# Patient Record
Sex: Male | Born: 1985 | Race: White | Hispanic: No | Marital: Married | Smoking: Former smoker
Health system: Southern US, Community
[De-identification: ages and names within clinical notes are randomized; demographics above are authoritative.]

## PROBLEM LIST (undated history)

## (undated) DIAGNOSIS — K2970 Gastritis, unspecified, without bleeding: Secondary | ICD-10-CM

## (undated) HISTORY — DX: Gastritis, unspecified, without bleeding: K29.70

---

## 2011-02-04 ENCOUNTER — Ambulatory Visit (INDEPENDENT_AMBULATORY_CARE_PROVIDER_SITE_OTHER): Payer: PRIVATE HEALTH INSURANCE | Admitting: Cardiology

## 2011-02-04 ENCOUNTER — Encounter: Payer: Self-pay | Admitting: Cardiology

## 2011-02-04 DIAGNOSIS — E663 Overweight: Secondary | ICD-10-CM

## 2011-02-04 DIAGNOSIS — R9431 Abnormal electrocardiogram [ECG] [EKG]: Secondary | ICD-10-CM

## 2011-02-04 DIAGNOSIS — F17200 Nicotine dependence, unspecified, uncomplicated: Secondary | ICD-10-CM

## 2011-02-05 ENCOUNTER — Encounter: Payer: Self-pay | Admitting: Cardiology

## 2011-02-05 DIAGNOSIS — F17200 Nicotine dependence, unspecified, uncomplicated: Secondary | ICD-10-CM | POA: Insufficient documentation

## 2011-02-05 DIAGNOSIS — R9431 Abnormal electrocardiogram [ECG] [EKG]: Secondary | ICD-10-CM | POA: Insufficient documentation

## 2011-02-05 DIAGNOSIS — E663 Overweight: Secondary | ICD-10-CM | POA: Insufficient documentation

## 2011-02-05 NOTE — Progress Notes (Signed)
Subjective:      Patient ID: Luis Thompson is a 25 y.o. male.  Chief Complaint: HPI The Patient presents for evaluation of an abnormal EKG. He was recently noted to have an RSR prime in V1 on EKG. I do not see other EKGs for comparison. He has been complaining of some pain in his posterior neck on left side. This is with activity such as lifting weights he is not describing chest pressure jaw or teeth discomfort. Is not having any arm discomfort shortness of breath, PND or orthopnea. He has had no palpitations, presyncope or syncope. He does do athletic activities such as being on the treadmill occasionally he has no problems with this. ROS Negative for all other systems except as stated in the HPI.   Objective:    Physical Exam  Constitutional: He appears healthy.  Eyes: Conjunctivae are normal. Pupils are equal, round, and reactive to light.  Neck: Thyroid normal. No JVD present. No adenopathy. No thyromegaly present.  Cardiovascular: Normal rate, regular rhythm, S1 normal, S2 normal, normal heart sounds and intact distal pulses.   Extrasystoles are present. PMI is not displaced.  Exam reveals no S3, no S4, no distant heart sounds, no friction rub, no midsystolic click and no opening snap.   No murmur heard. Pulses:      Carotid pulses are on the right side with bruit, and on the left side with bruit.      Radial pulses are 3+ on the right side, and 3+ on the left side.       Femoral pulses are 3+ on the right side, and 3+ on the left side.      Popliteal pulses are 3+ on the right side, and 3+ on the left side.       Dorsalis pedis pulses are 3+ on the right side, and 3+ on the left side.       Posterior tibial pulses are 3+ on the right side, and 3+ on the left side.  Pulmonary/Chest: Effort normal and breath sounds normal. He has no wheezes. He has no rales. He exhibits no tenderness.  Abdominal: Soft. Bowel sounds are normal. He exhibits no distension and no mass. There is no  splenomegaly or hepatomegaly. There is no tenderness. No hernia.  Musculoskeletal: He exhibits no edema.  Neurological: He is alert and oriented to person, place, and time. He has normal motor skills and intact cranial nerves.  Skin: No rash noted.    Lab Review:      Assessment:    Abnormal EKG  Plan:

## 2011-02-09 NOTE — Assessment & Plan Note (Signed)
Summary: consult: head pain after weight lifting. per debbie office 56...   Visit Type:  Follow-up Primary Provider:  Helene Kelp, PAc  CC:  Abnormal EKG.  History of Present Illness: The patient presents for evaluation of an abnormal EKG.  he was recently noted to have RSR prime in V1 and EKG. He said no other EKGs for comparison as far as I understand. He has been complaining of some pain in his posterior neck on the left side. This is with activity such as lifting weight. He is not describing chest pressure, jaw or teeth discomfort. He's not having any arm discomfort, shortness of breath, PND or orthopnea. He's had no palpitations, presyncope or syncope. He does do athletic activities such as being on a treadmill occasionally. He said no problems with this.  Current Medications (verified): 1)  None  Allergies (verified): No Known Drug Allergies  Past History:  Past Medical History: None  Past Surgical History: None  Family History: No early heart disease, cardiomyopathy, sudden cardiac death or syncope.  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 He has been smoking or 4 yearspain is down to one half pack per day.  Review of Systems       As stated in the HPI and negative for all other systems.   Vital Signs:  Patient profile:   25 year old male Height:      73 inches Weight:      249 pounds BMI:     32.97 Pulse rate:   66 / minute Resp:     16 per minute BP sitting:   131 / 76  (right arm)  Vitals Entered By: Marrion Coy, CNA (February 04, 2011 11:12 AM)  Physical Exam  General:  Well developed, well nourished, in no acute distress. Head:  normocephalic and atraumatic Eyes:  PERRLA/EOM intact; conjunctiva and lids normal. Mouth:  Teeth, gums and palate normal. Oral mucosa normal. Neck:  Neck supple, no JVD. No masses, thyromegaly or abnormal cervical nodes. Chest Wall:  no deformities or breast masses noted Lungs:  Clear  bilaterally to auscultation and percussion. Heart:  Non-displaced PMI, chest non-tender; regular rate and rhythm, S1, S2 without murmurs, rubs or gallops. Carotid upstroke normal, no bruit. Normal abdominal aortic size, no bruits. Femorals normal pulses, no bruits. Pedals normal pulses. No edema, no varicosities. Abdomen:  Bowel sounds positive; abdomen soft and non-tender without masses, organomegaly, or hernias noted. No hepatosplenomegaly. Msk:  Back normal, normal gait. Muscle strength and tone normal. Extremities:  No clubbing or cyanosis. Neurologic:  Alert and oriented x 3. Skin:  Intact without lesions or rashes. Cervical Nodes:  no significant adenopathy Inguinal Nodes:  no significant adenopathy Psych:  Normal affect.   EKG  Procedure date:  01/28/2011  Findings:      Sinus rhythm,  rate 78, RSR prime V1, sinus arrhythmia, otherwise unremarkable EKG  Impression & Recommendations:  Problem # 1:  ELECTROCARDIOGRAM, ABNORMAL (ICD-794.31) The patient has an RSR prime but no clinical findings otherwise. No further workup is indicated. This he is unlikely to represent any cardiac pathology.  In particular there is no evidence for further conduction delay or clinical evidence for atrial septal defect.  Problem # 2:  OVERWEIGHT (ICD-278.02) We did discuss the need for weight loss with diet and exercise.  Problem # 3:  TOBACCO USER (ICD-305.1) We spent a long time (greater than 3 minutes) discussing that he stop smoking.  Patient Instructions: 1)  Your physician recommends that  you schedule a follow-up appointment as needed 2)  Your physician recommends that you continue on your current medications as directed. Please refer to the Current Medication list given to you today.

## 2011-05-24 ENCOUNTER — Encounter: Payer: Self-pay | Admitting: Cardiology

## 2013-12-07 ENCOUNTER — Ambulatory Visit (INDEPENDENT_AMBULATORY_CARE_PROVIDER_SITE_OTHER): Payer: PRIVATE HEALTH INSURANCE | Admitting: Nurse Practitioner

## 2013-12-07 ENCOUNTER — Ambulatory Visit: Payer: PRIVATE HEALTH INSURANCE | Admitting: Nurse Practitioner

## 2013-12-07 ENCOUNTER — Encounter: Payer: Self-pay | Admitting: Nurse Practitioner

## 2013-12-07 VITALS — BP 136/90 | HR 103 | Temp 99.0°F | Ht 73.0 in | Wt 284.0 lb

## 2013-12-07 DIAGNOSIS — R509 Fever, unspecified: Secondary | ICD-10-CM

## 2013-12-07 DIAGNOSIS — J069 Acute upper respiratory infection, unspecified: Secondary | ICD-10-CM

## 2013-12-07 LAB — POCT INFLUENZA A/B: Influenza B, POC: NEGATIVE

## 2013-12-07 MED ORDER — AZITHROMYCIN 250 MG PO TABS: ORAL_TABLET | ORAL | Status: DC

## 2013-12-07 NOTE — Progress Notes (Signed)
   Subjective:    Patient ID: Luis Thompson, male    DOB: August 06, 1986, 27 y.o.   MRN: [id number]  HPI  Patient in today c/o fever cough and congestion. SLight achness- cough productive   Review of Systems  Constitutional: Positive for fever and chills.  HENT: Positive for congestion, postnasal drip, rhinorrhea, sinus pressure and sore throat. Negative for trouble swallowing.   Respiratory: Positive for cough (productive at times).   Cardiovascular: Negative.        Objective:   Physical Exam  Constitutional: He appears well-developed and well-nourished.  HENT:  Right Ear: Hearing, tympanic membrane, external ear and ear canal normal.  Left Ear: Hearing, tympanic membrane, external ear and ear canal normal.  Nose: Mucosal edema and rhinorrhea present. Right sinus exhibits no maxillary sinus tenderness and no frontal sinus tenderness. Left sinus exhibits no maxillary sinus tenderness and no frontal sinus tenderness.  Mouth/Throat: Uvula is midline. Posterior oropharyngeal erythema (slight) present.  Cardiovascular: Normal rate and normal heart sounds.   Pulmonary/Chest: Effort normal and breath sounds normal.  Deep dry cough  Neurological: He is alert.  Skin: Skin is warm.  Psychiatric: He has a normal mood and affect. His behavior is normal. Judgment and thought content normal.   BP 136/90  Pulse 103  Temp(Src) 99 F (37.2 C) (Oral)  Ht 6\' 1"  (1.854 m)  Wt 284 lb (128.822 kg)  BMI 37.48 kg/m2 Results for orders placed in visit on 12/07/13  POCT INFLUENZA A/B      Result Value Range   Influenza A, POC Negative     Influenza B, POC Negative    POCT RAPID STREP A (OFFICE)      Result Value Range   Rapid Strep A Screen Negative  Negative          Assessment & Plan:   1. Fever, unspecified   2. Upper respiratory infection    Meds ordered this encounter  Medications  . azithromycin (ZITHROMAX Z-PAK) 250 MG tablet    Sig: As directed    Dispense:  6 each    Refill:   0    Order Specific Question:  Supervising Provider    Answer:  Ernestina Penna [[id number]]   1. Take meds as prescribed 2. Use a cool mist humidifier especially during the winter months and when heat has  been humid. 3. Use saline nose sprays frequently 4. Saline irrigations of the nose can be very helpful if done frequently.  * 4X daily for 1 week*  * Use of a nettie pot can be helpful with this. Follow directions with this* 5. Drink plenty of fluids 6. Keep thermostat turn down low 7.For any cough or congestion  Use plain Mucinex- regular strength or max strength is fine   * Children- consult with Pharmacist for dosing 8. For fever or aces or pains- take tylenol or ibuprofen appropriate for age and weight.  * for fevers greater than 101 orally you may alternate ibuprofen and tylenol every  3 hours.   Mary-Margaret Daphine Deutscher, FNP

## 2013-12-07 NOTE — Patient Instructions (Signed)

## 2014-12-16 ENCOUNTER — Encounter: Payer: Self-pay | Admitting: Family Medicine

## 2014-12-16 ENCOUNTER — Ambulatory Visit (INDEPENDENT_AMBULATORY_CARE_PROVIDER_SITE_OTHER): Payer: PRIVATE HEALTH INSURANCE

## 2014-12-16 ENCOUNTER — Ambulatory Visit (INDEPENDENT_AMBULATORY_CARE_PROVIDER_SITE_OTHER): Payer: PRIVATE HEALTH INSURANCE | Admitting: Family Medicine

## 2014-12-16 VITALS — Ht 73.0 in | Wt 228.6 lb

## 2014-12-16 DIAGNOSIS — M545 Low back pain, unspecified: Secondary | ICD-10-CM

## 2014-12-16 MED ORDER — CYCLOBENZAPRINE HCL 10 MG PO TABS: 10.0000 mg | ORAL_TABLET | Freq: Three times a day (TID) | ORAL | Status: DC | PRN

## 2014-12-16 MED ORDER — NAPROXEN 500 MG PO TABS
500.0000 mg | ORAL_TABLET | Freq: Two times a day (BID) | ORAL | Status: DC
Start: 2014-12-16 — End: 2018-09-28

## 2014-12-16 NOTE — Progress Notes (Signed)
   Subjective:    Patient ID: Luis Thompson, male    DOB: 1986/07/13, 29 y.o.   MRN: [id number]  HPI Patient is c/o back pain on the left side and tailbone discomfort.  He denies any injury.  Review of Systems  Constitutional: Negative for fever.  HENT: Negative for ear pain.   Eyes: Negative for discharge.  Respiratory: Negative for cough.   Cardiovascular: Negative for chest pain.  Gastrointestinal: Negative for abdominal distention.  Endocrine: Negative for polyuria.  Genitourinary: Negative for difficulty urinating.  Musculoskeletal: Negative for gait problem and neck pain.  Skin: Negative for color change and rash.  Neurological: Negative for speech difficulty and headaches.  Psychiatric/Behavioral: Negative for agitation.       Objective:    Ht  (1.854 m)  Wt 228 lb 9.6 oz (103.692 kg)  BMI 30.17 kg/m2 Physical Exam  Constitutional: He is oriented to person, place, and time. He appears well-developed and well-nourished.  HENT:  Head: Normocephalic and atraumatic.  Mouth/Throat: Oropharynx is clear and moist.  Eyes: Pupils are equal, round, and reactive to light.  Neck: Normal range of motion. Neck supple.  Cardiovascular: Normal rate and regular rhythm.   No murmur heard. Pulmonary/Chest: Effort normal and breath sounds normal.  Abdominal: Soft. Bowel sounds are normal. There is no tenderness.  Musculoskeletal:  TTP LS spine left lumbar paraspinous muscles.  Neurological: He is alert and oriented to person, place, and time.  Skin: Skin is warm and dry.  Psychiatric: He has a normal mood and affect.    Xray LS spine - No fracture Prelimnary reading by Angeline Slim      Assessment & Plan:     ICD-9-CM ICD-10-CM   1. Back pain at L4-L5 level 724.2 M54.5 DG Lumbar Spine 2-3 Views     naproxen (NAPROSYN) 500 MG tablet     cyclobenzaprine (FLEXERIL) 10 MG tablet     No Follow-up on file.  Deatra Canter FNP

## 2018-08-02 DIAGNOSIS — M19079 Primary osteoarthritis, unspecified ankle and foot: Secondary | ICD-10-CM | POA: Insufficient documentation

## 2018-09-28 ENCOUNTER — Ambulatory Visit: Payer: PRIVATE HEALTH INSURANCE | Admitting: Family Medicine

## 2018-09-28 ENCOUNTER — Ambulatory Visit (INDEPENDENT_AMBULATORY_CARE_PROVIDER_SITE_OTHER): Payer: Commercial Managed Care - PPO

## 2018-09-28 ENCOUNTER — Encounter: Payer: Self-pay | Admitting: Family Medicine

## 2018-09-28 VITALS — BP 120/76 | HR 73 | Temp 97.0°F | Ht 73.0 in | Wt 256.0 lb

## 2018-09-28 DIAGNOSIS — R0781 Pleurodynia: Secondary | ICD-10-CM

## 2018-09-28 DIAGNOSIS — Z23 Encounter for immunization: Secondary | ICD-10-CM

## 2018-09-28 DIAGNOSIS — M940 Chondrocostal junction syndrome [Tietze]: Secondary | ICD-10-CM | POA: Diagnosis not present

## 2018-09-28 MED ORDER — NAPROXEN 500 MG PO TABS: 500.0000 mg | ORAL_TABLET | Freq: Two times a day (BID) | ORAL | 1 refills | Status: DC

## 2018-09-28 NOTE — Patient Instructions (Signed)
Costochondritis Costochondritis is swelling and irritation (inflammation) of the tissue (cartilage) that connects your ribs to your breastbone (sternum). This causes pain in the front of your chest. Usually, the pain:  Starts gradually.  Is in more than one rib.  This condition usually goes away on its own over time. Follow these instructions at home:  Do not do anything that makes your pain worse.  If directed, put ice on the painful area: ? Put ice in a plastic bag. ? Place a towel between your skin and the bag. ? Leave the ice on for 20 minutes, 2-3 times a day.  If directed, put heat on the affected area as often as told by your doctor. Use the heat source that your doctor tells you to use, such as a moist heat pack or a heating pad. ? Place a towel between your skin and the heat source. ? Leave the heat on for 20-30 minutes. ? Take off the heat if your skin turns bright red. This is very important if you cannot feel pain, heat, or cold. You may have a greater risk of getting burned.  Take over-the-counter and prescription medicines only as told by your doctor.  Return to your normal activities as told by your doctor. Ask your doctor what activities are safe for you.  Keep all follow-up visits as told by your doctor. This is important. Contact a doctor if:  You have chills or a fever.  Your pain does not go away or it gets worse.  You have a cough that does not go away. Get help right away if:  You are short of breath. This information is not intended to replace advice given to you by your health care provider. Make sure you discuss any questions you have with your health care provider. Document Released: 05/17/2008 Document Revised: 06/18/2016 Document Reviewed: 03/24/2016 Elsevier Interactive Patient Education  2018 [employer]vier [employer]  

## 2018-09-28 NOTE — Progress Notes (Signed)
Subjective:    Patient ID: Luis Thompson, male    DOB: 1986/07/31, 32 y.o.   MRN: [id number]  Chief Complaint:  Pain in left rib area (x 1 month; had been doing a lot of working out and began noticing pain in the area so he took a break from that but is still hurting)   HPI: Luis Thompson is a 32 y.o. male presenting on 09/28/2018 for Pain in left rib area (x 1 month; had been doing a lot of working out and began noticing pain in the area so he took a break from that but is still hurting)  Pt presents today for left anterior lower rib pain. States this started about 1-2 months ago after increasing his work out routine. States the pain is worse with lifting, certain movements, picking up items, and deep breathing. He states it is a dull ache that gets worse with the above activities. Pt states when he is resting the pain is not noticeable. Pt denies fever, chills, shortness of breath, chest pain, palpitations, nausea, abdominal pain, or diaphoresis.   Relevant past medical, surgical, family, and social history reviewed and updated as indicated.  Allergies and medications reviewed and updated.   Past Medical History:  Diagnosis Date  . Gastritis     History reviewed. No pertinent surgical history.  Social History   Socioeconomic History  . Marital status: Married    Spouse name: Not on file  . Number of children: Not on file  . Years of education: Not on file  . Highest education level: Not on file  Occupational History  . Not on file  Social Needs  . Financial resource strain: Not on file  . Food insecurity:    Worry: Not on file    Inability: Not on file  . Transportation needs:    Medical: Not on file    Non-medical: Not on file  Tobacco Use  . Smoking status: Former Smoker    Packs/day: 0.50  . Smokeless tobacco: Never Used  Substance and Sexual Activity  . Alcohol use: No  . Drug use: No  . Sexual activity: Yes  Lifestyle  . Physical activity:    Days per  week: Not on file    Minutes per session: Not on file  . Stress: Not on file  Relationships  . Social connections:    Talks on phone: Not on file    Gets together: Not on file    Attends religious service: Not on file    Active member of club or organization: Not on file    Attends meetings of clubs or organizations: Not on file    Relationship status: Not on file  . Intimate partner violence:    Fear of current or ex partner: Not on file    Emotionally abused: Not on file    Physically abused: Not on file    Forced sexual activity: Not on file  Other Topics Concern  . Not on file  Social History Narrative  . Not on file    Outpatient Encounter Medications as of 09/28/2018  Medication Sig  . naproxen (NAPROSYN) 500 MG tablet Take 1 tablet (500 mg total) by mouth 2 (two) times daily with a meal.  . [DISCONTINUED] cyclobenzaprine (FLEXERIL) 10 MG tablet Take 1 tablet (10 mg total) by mouth 3 (three) times daily as needed for muscle spasms.  . [DISCONTINUED] naproxen (NAPROSYN) 500 MG tablet Take 1 tablet (500 mg total) by mouth 2 (  two) times daily with a meal.   No facility-administered encounter medications on file as of 09/28/2018.     No Known Allergies  Review of Systems  Constitutional: Negative for chills and fever.  Respiratory: Negative for cough, chest tightness and shortness of breath.   Cardiovascular: Negative for chest pain, palpitations and leg swelling.  Gastrointestinal: Negative for abdominal pain, constipation, diarrhea, nausea and vomiting.  Musculoskeletal: Positive for myalgias (left rib pain).  Neurological: Negative for dizziness, syncope and weakness.  All other systems reviewed and are negative.       Objective:    BP 120/76   Pulse 73   Temp (!) 97 F (36.1 C) (Oral)   Ht 6\' 1"  (1.854 m)   Wt 256 lb (116.1 kg)   BMI 33.78 kg/m    Wt Readings from Last 3 Encounters:  09/28/18 256 lb (116.1 kg)  12/16/14 228 lb 9.6 oz (103.7 kg)    12/07/13 284 lb (128.8 kg)    Physical Exam  Constitutional: He is oriented to person, place, and time. He appears well-developed and well-nourished. He is cooperative. No distress.  HENT:  Head: Normocephalic and atraumatic.  Neck: Trachea normal and phonation normal. No JVD present. Carotid bruit is not present.  Cardiovascular: Normal rate, regular rhythm, normal heart sounds and normal pulses. Exam reveals no gallop and no friction rub.  No murmur heard. Pulmonary/Chest: Effort normal and breath sounds normal. No respiratory distress. He has no decreased breath sounds. He has no wheezes. He has no rhonchi. He has no rales. He exhibits tenderness (left lower anterior chest).  Abdominal: Soft. Bowel sounds are normal. He exhibits no distension and no mass. There is no hepatosplenomegaly. There is no tenderness. There is no rebound and no guarding. No hernia.  Neurological: He is alert and oriented to person, place, and time.  Skin: Skin is warm, dry and intact. Capillary refill takes less than 2 seconds.  Psychiatric: He has a normal mood and affect. His speech is normal and behavior is normal. Judgment and thought content normal. Cognition and memory are normal.  Nursing note and vitals reviewed.     X:Ray: Chest with left rib detail: No fracture noted, no pneumothorax. Preliminary x-ray reading by Kari Baars, FNP-C, [facility].   Pertinent labs & imaging results that were available during my care of the patient were reviewed by me and considered in my medical decision making.  Assessment & Plan:  Thoren was seen today for pain in left rib area.  Diagnoses and all orders for this visit:  Costochondritis Moist heat 3-4 times per day for 15-20 minutes at a time. Activity as tolerated. Medications as prescribed.  -     naproxen (NAPROSYN) 500 MG tablet; Take 1 tablet (500 mg total) by mouth 2 (two) times daily with a meal.  Rib pain on left side No acute findings on preliminary read.  Will notify pt if radiologist has additional findings.  -     DG Ribs Unilateral W/Chest Left; Future     Continue all other maintenance medications.  Follow up plan: Return if symptoms worsen or fail to improve.  Educational handout given for costochondritis  The above assessment and management plan was discussed with the patient. The patient verbalized understanding of and has agreed to the management plan. Patient is aware to call the clinic if symptoms persist or worsen. Patient is aware when to return to the clinic for a follow-up visit. Patient educated on when it is appropriate to  go to the emergency department.   Kari Baars, FNP-C [facility] [phone]

## 2019-06-18 ENCOUNTER — Encounter: Payer: Self-pay | Admitting: Family Medicine

## 2019-06-18 ENCOUNTER — Other Ambulatory Visit: Payer: Self-pay

## 2019-06-18 ENCOUNTER — Telehealth: Payer: Self-pay | Admitting: *Deleted

## 2019-06-18 ENCOUNTER — Ambulatory Visit (INDEPENDENT_AMBULATORY_CARE_PROVIDER_SITE_OTHER): Payer: Commercial Managed Care - PPO | Admitting: Family Medicine

## 2019-06-18 DIAGNOSIS — Z20822 Contact with and (suspected) exposure to covid-19: Secondary | ICD-10-CM

## 2019-06-18 DIAGNOSIS — J029 Acute pharyngitis, unspecified: Secondary | ICD-10-CM

## 2019-06-18 MED ORDER — AMOXICILLIN 500 MG PO CAPS: 500.0000 mg | ORAL_CAPSULE | Freq: Two times a day (BID) | ORAL | 0 refills | Status: DC

## 2019-06-18 NOTE — Telephone Encounter (Signed)
Pt scheduled for covid testing today @ 9:45 @ [facility]. Instructions given and order placed.

## 2019-06-18 NOTE — Telephone Encounter (Signed)
-----   Message from Worthy Rancher, MD sent at 06/18/2019  9:14 AM EDT ----- Coronavirus testing

## 2019-06-18 NOTE — Progress Notes (Signed)
   Virtual Visit via telephone Note  I connected with Sephiroth Mcluckie on 06/18/19 at 0903 by telephone and verified that I am speaking with the correct person using two identifiers. Guerin Lashomb is currently located at home and no other people are currently with her during visit. The provider, Fransisca Kaufmann Dettinger, MD is located in their office at time of visit.  Call ended at 0914  I discussed the limitations, risks, security and privacy concerns of performing an evaluation and management service by telephone and the availability of in person appointments. I also discussed with the patient that there may be a patient responsible charge related to this service. The patient expressed understanding and agreed to proceed.   History and Present Illness: Patient calls in complaining of sore throat and congestion that has been going on for the past 2 days.  He has noticed redness and pus in the back of his throat.  He developed a sinus congestion headache that started today.  He has been taking claritin and tylenol which is helping some.  He denies any covid contacts that he knows of.  He denies any fevers or chills or shortness of breath.   No diagnosis found.  Outpatient Encounter Medications as of 06/18/2019  Medication Sig  . naproxen (NAPROSYN) 500 MG tablet Take 1 tablet (500 mg total) by mouth 2 (two) times daily with a meal.   No facility-administered encounter medications on file as of 06/18/2019.     Review of Systems  Constitutional: Negative for chills and fever.  HENT: Positive for congestion, postnasal drip, rhinorrhea, sinus pressure, sneezing and sore throat. Negative for ear discharge, ear pain and voice change.   Eyes: Negative for pain, discharge, redness and visual disturbance.  Respiratory: Positive for cough. Negative for shortness of breath and wheezing.   Cardiovascular: Negative for chest pain and leg swelling.  Musculoskeletal: Negative for back pain, gait problem and  myalgias.  Skin: Negative for rash.  All other systems reviewed and are negative.   Observations/Objective: Patient sounds comfortable and in no acute distress  Assessment and Plan: Problem List Items Addressed This Visit    None    Visit Diagnoses    Pharyngitis, unspecified etiology    -  Primary   Relevant Medications   amoxicillin (AMOXIL) 500 MG capsule   Other Relevant Orders   Temperature monitoring       Follow Up Instructions: Recommended COVID testing, sounds most likely pharyngitis like strep and will treat as such  Recommended quarantine until symptoms resolve and testing is negative.   I discussed the assessment and treatment plan with the patient. The patient was provided an opportunity to ask questions and all were answered. The patient agreed with the plan and demonstrated an understanding of the instructions.   The patient was advised to call back or seek an in-person evaluation if the symptoms worsen or if the condition fails to improve as anticipated.  The above assessment and management plan was discussed with the patient. The patient verbalized understanding of and has agreed to the management plan. Patient is aware to call the clinic if symptoms persist or worsen. Patient is aware when to return to the clinic for a follow-up visit. Patient educated on when it is appropriate to go to the emergency department.    I provided 11 minutes of non-face-to-face time during this encounter.    Worthy Rancher, MD

## 2019-06-19 ENCOUNTER — Encounter (INDEPENDENT_AMBULATORY_CARE_PROVIDER_SITE_OTHER): Payer: Self-pay

## 2019-06-20 ENCOUNTER — Encounter (INDEPENDENT_AMBULATORY_CARE_PROVIDER_SITE_OTHER): Payer: Self-pay

## 2019-06-21 ENCOUNTER — Encounter (INDEPENDENT_AMBULATORY_CARE_PROVIDER_SITE_OTHER): Payer: Self-pay

## 2019-06-22 ENCOUNTER — Encounter (INDEPENDENT_AMBULATORY_CARE_PROVIDER_SITE_OTHER): Payer: Self-pay

## 2019-06-23 ENCOUNTER — Encounter (INDEPENDENT_AMBULATORY_CARE_PROVIDER_SITE_OTHER): Payer: Self-pay

## 2019-06-23 LAB — NOVEL CORONAVIRUS, NAA: SARS-CoV-2, NAA: NOT DETECTED

## 2019-11-07 ENCOUNTER — Encounter: Payer: Self-pay | Admitting: Family Medicine

## 2019-11-07 ENCOUNTER — Ambulatory Visit (INDEPENDENT_AMBULATORY_CARE_PROVIDER_SITE_OTHER): Payer: PRIVATE HEALTH INSURANCE | Admitting: Family Medicine

## 2019-11-07 DIAGNOSIS — M940 Chondrocostal junction syndrome [Tietze]: Secondary | ICD-10-CM

## 2019-11-07 DIAGNOSIS — R0781 Pleurodynia: Secondary | ICD-10-CM | POA: Diagnosis not present

## 2019-11-07 MED ORDER — MELOXICAM 15 MG PO TABS: 15.0000 mg | ORAL_TABLET | Freq: Every day | ORAL | 0 refills | Status: DC

## 2019-11-07 MED ORDER — PREDNISONE 10 MG (21) PO TBPK: ORAL_TABLET | ORAL | 0 refills | Status: DC

## 2019-11-07 NOTE — Progress Notes (Signed)
Virtual Visit via telephone Note Due to COVID-19 pandemic this visit was conducted virtually. This visit type was conducted due to national recommendations for restrictions regarding the COVID-19 Pandemic (e.g. social distancing, sheltering in place) in an effort to limit this patient's exposure and mitigate transmission in our community. All issues noted in this document were discussed and addressed.  A physical exam was not performed with this format.   I connected with Luis Thompson on 11/07/2019 at 1315 by telephone and verified that I am speaking with the correct person using two identifiers. Luis Thompson is currently located at home and no one is currently with them during visit. The provider, Kari Baars, FNP is located in their office at time of visit.  I discussed the limitations, risks, security and privacy concerns of performing an evaluation and management service by telephone and the availability of in person appointments. I also discussed with the patient that there may be a patient responsible charge related to this service. The patient expressed understanding and agreed to proceed.  Subjective:  Patient ID: Luis Thompson, male    DOB: 1986-01-13, 33 y.o.   MRN: [id number]  Chief Complaint:  Rib pain   HPI: Luis Thompson is a 33 y.o. male presenting on 11/07/2019 for Rib pain   Pt reports left lower rib pain that is worse with heavy lifting, working out, and certain movements. States it started about 2 weeks ago and is waxing and waning. Denies central chest pain or pressure. States the pain is aching to sharp in nature, 5/10 at worst. States relieved with rest. Has been taking bayer back and body with relief of symptoms.     Relevant past medical, surgical, family, and social history reviewed and updated as indicated.  Allergies and medications reviewed and updated.   Past Medical History:  Diagnosis Date  . Gastritis     History reviewed. No pertinent surgical  history.  Social History   Socioeconomic History  . Marital status: Married    Spouse name: Not on file  . Number of children: Not on file  . Years of education: Not on file  . Highest education level: Not on file  Occupational History  . Not on file  Social Needs  . Financial resource strain: Not on file  . Food insecurity    Worry: Not on file    Inability: Not on file  . Transportation needs    Medical: Not on file    Non-medical: Not on file  Tobacco Use  . Smoking status: Former Smoker    Packs/day: 0.50  . Smokeless tobacco: Never Used  Substance and Sexual Activity  . Alcohol use: No  . Drug use: No  . Sexual activity: Yes  Lifestyle  . Physical activity    Days per week: Not on file    Minutes per session: Not on file  . Stress: Not on file  Relationships  . Social Musician on phone: Not on file    Gets together: Not on file    Attends religious service: Not on file    Active member of club or organization: Not on file    Attends meetings of clubs or organizations: Not on file    Relationship status: Not on file  . Intimate partner violence    Fear of current or ex partner: Not on file    Emotionally abused: Not on file    Physically abused: Not on file    Forced sexual  activity: Not on file  Other Topics Concern  . Not on file  Social History Narrative  . Not on file    Outpatient Encounter Medications as of 11/07/2019  Medication Sig  . meloxicam (MOBIC) 15 MG tablet Take 1 tablet (15 mg total) by mouth daily.  . predniSONE (STERAPRED UNI-PAK 21 TAB) 10 MG (21) TBPK tablet As directed x 6 days  . [DISCONTINUED] amoxicillin (AMOXIL) 500 MG capsule Take 1 capsule (500 mg total) by mouth 2 (two) times daily.  . [DISCONTINUED] naproxen (NAPROSYN) 500 MG tablet Take 1 tablet (500 mg total) by mouth 2 (two) times daily with a meal.   No facility-administered encounter medications on file as of 11/07/2019.     No Known Allergies  Review  of Systems  Constitutional: Negative for activity change, appetite change, chills, diaphoresis, fatigue, fever and unexpected weight change.  HENT: Negative.   Eyes: Negative.  Negative for photophobia and visual disturbance.  Respiratory: Negative for cough, chest tightness and shortness of breath.   Cardiovascular: Negative for chest pain, palpitations and leg swelling.  Gastrointestinal: Negative for blood in stool, constipation, diarrhea, nausea and vomiting.  Endocrine: Negative.   Genitourinary: Negative for decreased urine volume, difficulty urinating, dysuria, frequency and urgency.  Musculoskeletal: Positive for arthralgias and myalgias. Negative for back pain, gait problem, joint swelling, neck pain and neck stiffness.  Skin: Negative.   Allergic/Immunologic: Negative.   Neurological: Negative for dizziness, tremors, seizures, syncope, facial asymmetry, speech difficulty, weakness, light-headedness, numbness and headaches.  Hematological: Negative.   Psychiatric/Behavioral: Negative for confusion, hallucinations, sleep disturbance and suicidal ideas.  All other systems reviewed and are negative.        Observations/Objective: No vital signs or physical exam, this was a telephone or virtual health encounter.  Pt alert and oriented, answers all questions appropriately, and able to speak in full sentences.    Assessment and Plan: Tranquilino was seen today for rib pain.  Diagnoses and all orders for this visit:  Rib pain on left side Costochondritis No red flags concerning for acute cardiovascular event, pt aware of symptoms that require emergent evaluation. Symptomatic care along with below. Stop naproxen and start Mobic. Report any new, worsening, or persistent symptoms. Follow up in 4 weeks.  -     predniSONE (STERAPRED UNI-PAK 21 TAB) 10 MG (21) TBPK tablet; As directed x 6 days -     meloxicam (MOBIC) 15 MG tablet; Take 1 tablet (15 mg total) by mouth daily.     Follow  Up Instructions: Return in about 4 weeks (around 12/05/2019), or if symptoms worsen or fail to improve.    I discussed the assessment and treatment plan with the patient. The patient was provided an opportunity to ask questions and all were answered. The patient agreed with the plan and demonstrated an understanding of the instructions.   The patient was advised to call back or seek an in-person evaluation if the symptoms worsen or if the condition fails to improve as anticipated.  The above assessment and management plan was discussed with the patient. The patient verbalized understanding of and has agreed to the management plan. Patient is aware to call the clinic if they develop any new symptoms or if symptoms persist or worsen. Patient is aware when to return to the clinic for a follow-up visit. Patient educated on when it is appropriate to go to the emergency department.    I provided 15 minutes of non-face-to-face time during this encounter. The call started  at 1315. The call ended at 1330. The other time was used for coordination of care.    Kari BaarsMichelle Ethelle Ola, FNP-C [facility] [address] [place], [place] [address] [phone] 11/07/2019

## 2019-12-31 ENCOUNTER — Telehealth: Payer: Self-pay | Admitting: Family Medicine

## 2019-12-31 ENCOUNTER — Ambulatory Visit (INDEPENDENT_AMBULATORY_CARE_PROVIDER_SITE_OTHER): Payer: PRIVATE HEALTH INSURANCE | Admitting: Physician Assistant

## 2019-12-31 ENCOUNTER — Encounter: Payer: Self-pay | Admitting: Physician Assistant

## 2019-12-31 DIAGNOSIS — M940 Chondrocostal junction syndrome [Tietze]: Secondary | ICD-10-CM

## 2019-12-31 DIAGNOSIS — R0781 Pleurodynia: Secondary | ICD-10-CM

## 2019-12-31 MED ORDER — MELOXICAM 15 MG PO TABS: 15.0000 mg | ORAL_TABLET | Freq: Every day | ORAL | 0 refills | Status: DC

## 2019-12-31 MED ORDER — PREDNISONE 10 MG (21) PO TBPK: ORAL_TABLET | ORAL | 0 refills | Status: DC
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
## 2020-02-05 ENCOUNTER — Other Ambulatory Visit: Payer: Self-pay

## 2020-02-06 ENCOUNTER — Ambulatory Visit: Payer: Self-pay | Admitting: Family Medicine

## 2020-02-06 ENCOUNTER — Ambulatory Visit (INDEPENDENT_AMBULATORY_CARE_PROVIDER_SITE_OTHER): Payer: 59

## 2020-02-06 ENCOUNTER — Encounter: Payer: Self-pay | Admitting: Family Medicine

## 2020-02-06 VITALS — BP 117/64 | HR 61 | Temp 98.9°F | Resp 20 | Ht 73.0 in | Wt 257.0 lb

## 2020-02-06 DIAGNOSIS — R0781 Pleurodynia: Secondary | ICD-10-CM

## 2020-02-06 DIAGNOSIS — M940 Chondrocostal junction syndrome [Tietze]: Secondary | ICD-10-CM

## 2020-02-06 MED ORDER — NAPROXEN 500 MG PO TABS: 500.0000 mg | ORAL_TABLET | Freq: Two times a day (BID) | ORAL | 1 refills | Status: DC

## 2020-02-06 NOTE — Progress Notes (Signed)
Subjective:  Patient ID: Luis Thompson, male    DOB: November 07, 1986, 34 y.o.   MRN: [id number]  Patient Care Team: Sonny Masters, FNP as PCP - General (Family Medicine)   Chief Complaint:  left rib pain   HPI: Luis Thompson is a 33 y.o. male presenting on 02/06/2020 for left rib pain   Chest Pain  This is a chronic problem. The current episode started more than 1 year ago. The onset quality is undetermined. Pain location: left ribs. The pain is at a severity of 3/10. The pain is mild. The quality of the pain is described as dull. The pain does not radiate. Pertinent negatives include no abdominal pain, back pain, claudication, cough, diaphoresis, dizziness, exertional chest pressure, fever, headaches, hemoptysis, irregular heartbeat, leg pain, lower extremity edema, malaise/fatigue, nausea, near-syncope, numbness, orthopnea, palpitations, PND, shortness of breath, sputum production, syncope, vomiting or weakness. The pain is aggravated by lifting, movement and exertion. He has tried rest and NSAIDs for the symptoms. The treatment provided significant relief. There are no known risk factors.  Pertinent negatives for past medical history include no seizures. Past medical history comments: rib fracture Prior diagnostic workup includes chest x-ray.    Relevant past medical, surgical, family, and social history reviewed and updated as indicated.  Allergies and medications reviewed and updated. Date reviewed: Chart in Epic.   Past Medical History:  Diagnosis Date  . Gastritis     History reviewed. No pertinent surgical history.  Social History   Socioeconomic History  . Marital status: Married    Spouse name: Not on file  . Number of children: Not on file  . Years of education: Not on file  . Highest education level: Not on file  Occupational History  . Not on file  Tobacco Use  . Smoking status: Former Smoker    Packs/day: 0.50  . Smokeless tobacco: Never Used  Substance and  Sexual Activity  . Alcohol use: No  . Drug use: No  . Sexual activity: Yes  Other Topics Concern  . Not on file  Social History Narrative  . Not on file   Social Determinants of Health   Financial Resource Strain:   . Difficulty of Paying Living Expenses: Not on file  Food Insecurity:   . Worried About Programme researcher, broadcasting/film/video in the Last Year: Not on file  . Ran Out of Food in the Last Year: Not on file  Transportation Needs:   . Lack of Transportation (Medical): Not on file  . Lack of Transportation (Non-Medical): Not on file  Physical Activity:   . Days of Exercise per Week: Not on file  . Minutes of Exercise per Session: Not on file  Stress:   . Feeling of Stress : Not on file  Social Connections:   . Frequency of Communication with Friends and Family: Not on file  . Frequency of Social Gatherings with Friends and Family: Not on file  . Attends Religious Services: Not on file  . Active Member of Clubs or Organizations: Not on file  . Attends Banker Meetings: Not on file  . Marital Status: Not on file  Intimate Partner Violence:   . Fear of Current or Ex-Partner: Not on file  . Emotionally Abused: Not on file  . Physically Abused: Not on file  . Sexually Abused: Not on file    Outpatient Encounter Medications as of 02/06/2020  Medication Sig  . naproxen (NAPROSYN) 500 MG tablet Take 1  tablet (500 mg total) by mouth 2 (two) times daily with a meal.  . [DISCONTINUED] meloxicam (MOBIC) 15 MG tablet Take 1 tablet (15 mg total) by mouth daily.  . [DISCONTINUED] predniSONE (STERAPRED UNI-PAK 21 TAB) 10 MG (21) TBPK tablet As directed x 6 days   No facility-administered encounter medications on file as of 02/06/2020.    No Known Allergies  Review of Systems  Constitutional: Negative for activity change, appetite change, chills, diaphoresis, fatigue, fever, malaise/fatigue and unexpected weight change.  HENT: Negative.   Eyes: Negative.  Negative for photophobia  and visual disturbance.  Respiratory: Negative for cough, hemoptysis, sputum production, chest tightness, shortness of breath and wheezing.   Cardiovascular: Positive for chest pain. Negative for palpitations, orthopnea, claudication, leg swelling, syncope, PND and near-syncope.  Gastrointestinal: Negative for abdominal pain, blood in stool, constipation, diarrhea, nausea and vomiting.  Endocrine: Negative.   Genitourinary: Negative for decreased urine volume, difficulty urinating, dysuria, frequency and urgency.  Musculoskeletal: Positive for arthralgias and myalgias. Negative for back pain, gait problem, joint swelling, neck pain and neck stiffness.  Skin: Negative.  Negative for color change and pallor.  Allergic/Immunologic: Negative.   Neurological: Negative for dizziness, tremors, seizures, syncope, facial asymmetry, speech difficulty, weakness, light-headedness, numbness and headaches.  Hematological: Negative.   Psychiatric/Behavioral: Negative for confusion, hallucinations, sleep disturbance and suicidal ideas.  All other systems reviewed and are negative.       Objective:  BP 117/64   Pulse 61   Temp 98.9 F (37.2 C)   Resp 20   Ht 6\' 1"  (1.854 m)   Wt 257 lb (116.6 kg)   SpO2 99%   BMI 33.91 kg/m    Wt Readings from Last 3 Encounters:  02/06/20 257 lb (116.6 kg)  09/28/18 256 lb (116.1 kg)  12/16/14 228 lb 9.6 oz (103.7 kg)    Physical Exam Vitals and nursing note reviewed.  Constitutional:      General: He is not in acute distress.    Appearance: Normal appearance. He is well-developed and well-groomed. He is obese. He is not ill-appearing, toxic-appearing or diaphoretic.  HENT:     Head: Normocephalic and atraumatic.     Jaw: There is normal jaw occlusion.     Right Ear: Hearing normal.     Left Ear: Hearing normal.     Nose: Nose normal.     Mouth/Throat:     Lips: Pink.     Mouth: Mucous membranes are moist.     Pharynx: Oropharynx is clear. Uvula  midline.  Eyes:     General: Lids are normal.     Extraocular Movements: Extraocular movements intact.     Conjunctiva/sclera: Conjunctivae normal.     Pupils: Pupils are equal, round, and reactive to light.  Neck:     Thyroid: No thyroid mass, thyromegaly or thyroid tenderness.     Vascular: No carotid bruit or JVD.     Trachea: Trachea and phonation normal.  Cardiovascular:     Rate and Rhythm: Normal rate and regular rhythm.     Chest Wall: PMI is not displaced.     Pulses: Normal pulses.     Heart sounds: Normal heart sounds. No murmur. No friction rub. No gallop.   Pulmonary:     Effort: Pulmonary effort is normal. No respiratory distress.     Breath sounds: Normal breath sounds. No wheezing.  Chest:     Chest wall: Tenderness present. No mass, lacerations, deformity, swelling, crepitus or edema. There is  no dullness to percussion.     Breasts: Breasts are symmetrical.    Abdominal:     General: Bowel sounds are normal. There is no distension or abdominal bruit.     Palpations: Abdomen is soft. There is no hepatomegaly or splenomegaly.     Tenderness: There is no abdominal tenderness. There is no right CVA tenderness or left CVA tenderness.     Hernia: No hernia is present.  Musculoskeletal:        General: Normal range of motion.     Cervical back: Normal range of motion and neck supple.     Right lower leg: No edema.     Left lower leg: No edema.  Lymphadenopathy:     Cervical: No cervical adenopathy.  Skin:    General: Skin is warm and dry.     Capillary Refill: Capillary refill takes less than 2 seconds.     Coloration: Skin is not cyanotic, jaundiced or pale.     Findings: No rash.  Neurological:     General: No focal deficit present.     Mental Status: He is alert and oriented to person, place, and time.     Cranial Nerves: Cranial nerves are intact.     Sensory: Sensation is intact.     Motor: Motor function is intact.     Coordination: Coordination is  intact.     Gait: Gait is intact.     Deep Tendon Reflexes: Reflexes are normal and symmetric.  Psychiatric:        Attention and Perception: Attention and perception normal.        Mood and Affect: Mood and affect normal.        Speech: Speech normal.        Behavior: Behavior normal. Behavior is cooperative.        Thought Content: Thought content normal.        Cognition and Memory: Cognition and memory normal.        Judgment: Judgment normal.     Results for orders placed or performed in visit on 06/18/19  Novel Coronavirus, NAA ([facility])  Result Value Ref Range   SARS-CoV-2, NAA Not Detected Not Detected       Pertinent labs & imaging results that were available during my care of the patient were reviewed by me and considered in my medical decision making.  Assessment & Plan:  Luis Thompson was seen today for left rib pain.  Diagnoses and all orders for this visit:  Rib pain on left side Costochondritis CXR unremarkable in office. Likely unresolved costochondritis. Will notify pt if radiology reading differs. Will treat with Naproxen twice daily for at least 4 weeks. Pt aware to take with food. Report any new, worsening, or persistent symptoms. Symptomatic care discussed in detail.  -     DG Ribs Unilateral W/Chest Left; Future -     naproxen (NAPROSYN) 500 MG tablet; Take 1 tablet (500 mg total) by mouth 2 (two) times daily with a meal.     Continue all other maintenance medications.  Follow up plan: Return in about 6 weeks (around 03/19/2020), or if symptoms worsen or fail to improve.  Continue healthy lifestyle choices, including diet (rich in fruits, vegetables, and lean proteins, and low in salt and simple carbohydrates) and exercise (at least 30 minutes of moderate physical activity daily).  Educational handout given for costochondritis  The above assessment and management plan was discussed with the patient. The patient verbalized understanding of and  has agreed to  the management plan. Patient is aware to call the clinic if they develop any new symptoms or if symptoms persist or worsen. Patient is aware when to return to the clinic for a follow-up visit. Patient educated on when it is appropriate to go to the emergency department.   Monia Pouch, FNP-C [facility] [phone]

## 2020-02-06 NOTE — Patient Instructions (Signed)
Costochondritis Costochondritis is swelling and irritation (inflammation) of the tissue (cartilage) that connects your ribs to your breastbone (sternum). This causes pain in the front of your chest. Usually, the pain:  Starts gradually.  Is in more than one rib. This condition usually goes away on its own over time. Follow these instructions at home:  Do not do anything that makes your pain worse.  If directed, put ice on the painful area: ? Put ice in a plastic bag. ? Place a towel between your skin and the bag. ? Leave the ice on for 20 minutes, 2-3 times a day.  If directed, put heat on the affected area as often as told by your doctor. Use the heat source that your doctor tells you to use, such as a moist heat pack or a heating pad. ? Place a towel between your skin and the heat source. ? Leave the heat on for 20-30 minutes. ? Take off the heat if your skin turns bright red. This is very important if you cannot feel pain, heat, or cold. You may have a greater risk of getting burned.  Take over-the-counter and prescription medicines only as told by your doctor.  Return to your normal activities as told by your doctor. Ask your doctor what activities are safe for you.  Keep all follow-up visits as told by your doctor. This is important. Contact a doctor if:  You have chills or a fever.  Your pain does not go away or it gets worse.  You have a cough that does not go away. Get help right away if:  You are short of breath. This information is not intended to replace advice given to you by your health care provider. Make sure you discuss any questions you have with your health care provider. Document Revised: 12/14/2017 Document Reviewed: 03/24/2016 Elsevier Patient Education  2020 [employer]  
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
## 2021-04-17 ENCOUNTER — Encounter: Payer: Self-pay | Admitting: Family Medicine

## 2021-04-17 ENCOUNTER — Ambulatory Visit (INDEPENDENT_AMBULATORY_CARE_PROVIDER_SITE_OTHER): Payer: Self-pay | Admitting: Family Medicine

## 2021-04-17 DIAGNOSIS — Z20828 Contact with and (suspected) exposure to other viral communicable diseases: Secondary | ICD-10-CM

## 2021-04-17 MED ORDER — OSELTAMIVIR PHOSPHATE 75 MG PO CAPS: 75.0000 mg | ORAL_CAPSULE | Freq: Two times a day (BID) | ORAL | 0 refills | Status: AC

## 2021-04-17 NOTE — Progress Notes (Signed)
   Virtual Visit  Note Due to COVID-19 pandemic this visit was conducted virtually. This visit type was conducted due to national recommendations for restrictions regarding the COVID-19 Pandemic (e.g. social distancing, sheltering in place) in an effort to limit this patient's exposure and mitigate transmission in our community. All issues noted in this document were discussed and addressed.  A physical exam was not performed with this format.  I connected with Luis Thompson on 04/17/21 at 1105 by telephone and verified that I am speaking with the correct person using two identifiers. Luis Thompson is currently located at home and his children is currently with him during the visit. The provider, Gabriel Earing, FNP is located in their office at time of visit.  I discussed the limitations, risks, security and privacy concerns of performing an evaluation and management service by telephone and the availability of in person appointments. I also discussed with the patient that there may be a patient responsible charge related to this service. The patient expressed understanding and agreed to proceed.  CC: exposure to flu  History and Present Illness:  HPI  Luis Thompson reports that his daughter tested positive for the flu yesterday. His son also has similar symptoms and his pediatrician believes that his son also has the flu. Their symptoms started yesterday. Luis Thompson reports that he he started to have a mild stuffy nose and headache today. He has not taken anything for his symptoms yet. He denies any other symptoms. He would like to know if he can take tamiflu.     ROS As per HPI.   Observations/Objective: Return to office for new or worsening symptoms, or if symptoms persist.   Assessment and Plan: Nealy was seen today for influenza.  Diagnoses and all orders for this visit:  Exposure to influenza Tamilful as below. Discussed he likely has the flu as he now has nasal congestion and mild  headache. Rest, hydration, tylenol for pain, OTC cough and cold medication as needed. Return to office for new or worsening symptoms.  -     oseltamivir (TAMIFLU) 75 MG capsule; Take 1 capsule (75 mg total) by mouth 2 (two) times daily for 5 days.     Follow Up Instructions: As needed.     I discussed the assessment and treatment plan with the patient. The patient was provided an opportunity to ask questions and all were answered. The patient agreed with the plan and demonstrated an understanding of the instructions.   The patient was advised to call back or seek an in-person evaluation if the symptoms worsen or if the condition fails to improve as anticipated.  The above assessment and management plan was discussed with the patient. The patient verbalized understanding of and has agreed to the management plan. Patient is aware to call the clinic if symptoms persist or worsen. Patient is aware when to return to the clinic for a follow-up visit. Patient educated on when it is appropriate to go to the emergency department.   Time call ended:  1117  I provided 12 minutes of  non face-to-face time during this encounter.    Gabriel Earing, FNP

## 2023-11-17 ENCOUNTER — Encounter: Payer: Self-pay | Admitting: Urology

## 2023-11-17 ENCOUNTER — Ambulatory Visit: Payer: BC Managed Care – PPO | Admitting: Urology

## 2023-11-17 VITALS — BP 142/86 | HR 76 | Ht 73.0 in | Wt 286.0 lb

## 2023-11-17 DIAGNOSIS — Z3009 Encounter for other general counseling and advice on contraception: Secondary | ICD-10-CM

## 2023-11-17 NOTE — Patient Instructions (Signed)
Taking care of yourself after a VASECTOMY                                              Patient Information Sheet        The following information will reinforce some of the instructions that your doctor has given you.  Day of Procedure: 1) Wear the scrotal supporter and gauze pad 2) Use an ice pack on the scrotum for 15 minutes every hour for 48 hours to help reduce discomfort, swelling and bruising (do NOT place ice directly on your skin, but place on top of the supporter) 3) Expect some clear to pinkish drainage at the surgical site for the first 24-48 hours 4) If needed, use pain medications provided or ibuprofen 800 mg every 8 hours for discomfort 5) Avoid strenuous activities like mowing, lifting, jogging and exercising for 1 week.  Take it easy! 6) If you develop a fever over 101 F or sudden onset of significant swelling within the first 12 hours, please call to report this to your doctor as soon as possible.     Day Two and Three: 1) You may take a shower, but avoid tubs, pools or hot tubs. 2) Continue to wear the scrotal supporter as needed for comfort and change or remove the gauze pad if desired 3) Keep taking it easy!  Avoid strenuous activities like mowing, lifting, jogging and exercising.   4) Continue to watch for signs or symptoms of fever or significant swelling 5) Apply a small amount of antibiotic ointment to incision 1-2 times/day  The rest of the week: 1) Gradually return to normal physical activities after one week.  A return of soreness might mean you are        "doing too much too soon". 2) Avoid sexual activity for 10 days after the procedure 3) Continue to take a shower, but avoid tubs, pools or hot tubs 4) Wearing the scrotal supporter is optional based on your comfort.     Remember to use an alternate form of contraception for 3 months until you have been checked and CLEARED by your urologist!  3-Month lab appointment:  1) The lab technician will need to  look at a semen sample under a microscope  2) Use the specimen cup provided to collect the sample AT HOME 1 hour before the appointment  3) DO NOT refrigerate the specimen, but keep at room or body temperature  4) Avoid ejaculation for 2-5 days before collecting the specimen  5) Collect the entire specimen by masturbation using NO lubricant  6) Make sure your name, MR number, date and time of collection are on the cup  

## 2023-11-17 NOTE — Progress Notes (Signed)
Assessment: 1. Encounter for vasectomy assessment     Plan: Schedule for vasectomy per patient request He declined rx for alprazolam    Chief Complaint:  Chief Complaint  Patient presents with   VAS Consult    History of Present Illness:  Luis Thompson is a 37 y.o. male who is seen for vasectomy evaluation. He is married with 2 children.  No history of scrotal trauma or infection.  Past Medical History:  Past Medical History:  Diagnosis Date   Gastritis     Past Surgical History:  No past surgical history on file.  Allergies:  No Known Allergies  Family History:  Family History  Problem Relation Age of Onset   Diabetes Mother    Cancer Maternal Grandmother        breast   Arrhythmia Neg Hx    Fainting Neg Hx    Heart attack Neg Hx    Heart disease Neg Hx    Heart failure Neg Hx     Social History:  Social History   Tobacco Use   Smoking status: Former    Current packs/day: 0.50    Types: Cigarettes   Smokeless tobacco: Never  Vaping Use   Vaping status: Never Used  Substance Use Topics   Alcohol use: No   Drug use: No    Review of symptoms:  Constitutional:  Negative for unexplained weight loss, night sweats, fever, chills ENT:  Negative for nose bleeds, sinus pain, painful swallowing CV:  Negative for chest pain, shortness of breath, exercise intolerance, palpitations, loss of consciousness Resp:  Negative for cough, wheezing, shortness of breath GI:  Negative for nausea, vomiting, diarrhea, bloody stools GU:  Positives noted in HPI; otherwise negative for gross hematuria, dysuria, urinary incontinence Neuro:  Negative for seizures, poor balance, limb weakness, slurred speech Psych:  Negative for lack of energy, depression, anxiety Endocrine:  Negative for polydipsia, polyuria, symptoms of hypoglycemia (dizziness, hunger, sweating) Hematologic:  Negative for anemia, purpura, petechia, prolonged or excessive bleeding, use of anticoagulants   Allergic:  Negative for difficulty breathing or choking as a result of exposure to anything; no shellfish allergy; no allergic response (rash/itch) to materials, foods  Physical exam: BP (!) 142/86   Pulse 76   Ht 6\' 1"  (1.854 m)   Wt 286 lb (129.7 kg)   BMI 37.73 kg/m  GENERAL APPEARANCE:  Well appearing, well developed, well nourished, NAD HEENT:  Atraumatic, normocephalic, oropharynx clear NECK:  Supple without lymphadenopathy or thyromegaly ABDOMEN:  Soft, non-tender, no masses EXTREMITIES:  Moves all extremities well, without clubbing, cyanosis, or edema NEUROLOGIC:  Alert and oriented x 3, normal gait, CN II-XII grossly intact MENTAL STATUS:  appropriate BACK:  Non-tender to palpation, No CVAT SKIN:  Warm, dry, and intact GU: Penis:  circumcised Meatus: Normal Scrotum: vas palpated bilaterally Testis: normal without masses bilateral Epididymis: normal  Results: None  VASECTOMY CONSULTATION  [name] presents for vasectomy consultation today.  He is a 37 y.o. male, Married with 2  children .  He and his wife have discussed the issues regarding long-term fertility and are comfortable with this decision.  He presents for consideration for vasectomy.  I discussed the issues in detail with him today and he expressed no reservations.  As to the procedure, no scalpel technique vasectomy is explained and reviewed in detail.  Generalized risks including but not limited to bleeding, infection, orchalgia, testicular atrophy, epididymitis, scrotal hematoma, and chronic pain are discussed.   Additionally, he understands  that the possibility of vas recanalization following vasectomy is possible although rare.  Most importantly, the patient understands that he is not sterile initially and will need a semen analysis check to confirm sterility such that no sperm are seen.  He is advised to avoid ejaculation for 10 days following the procedure.  The initial semen analysis will be checked  in approximately 12 weeks and in some patients, several months may be required for clearance of all sperm.  He reports a clear understanding of the need for continued birth control until sterility is confirmed.  Otherwise, general issues regarding local anesthesia, prep, alprazolam are discussed and he reports a clear understanding.

## 2023-11-30 ENCOUNTER — Telehealth: Payer: Self-pay

## 2023-11-30 NOTE — Telephone Encounter (Signed)
Spoke w Luis Thompson W. At [facility], Timmothy Euler will be covered 30% co insurance after deductible is met. Deductible is $2500 of which $10.40 has been met. Pt will need to pay a $500 deposit day of vas. Pt aware. Call ref # [id number]. -[place]

## 2023-11-30 NOTE — Telephone Encounter (Signed)
-----   Message from [place] J sent at 11/17/2023 10:31 AM EST ----- Regarding: Vas Name: Rial, Sabir MRN: [id number] Date: 12/26/2023 Status: Sch Time: 11:00 AM Length: 60 Visit Type: VASECTOMY [1218] Copay: $0.00 Provider: Milderd Meager., MD

## 2023-12-26 ENCOUNTER — Ambulatory Visit: Payer: BC Managed Care – PPO | Admitting: Urology

## 2023-12-26 VITALS — BP 132/83 | HR 83 | Ht 73.0 in | Wt 286.0 lb

## 2023-12-26 DIAGNOSIS — Z302 Encounter for sterilization: Secondary | ICD-10-CM | POA: Diagnosis not present

## 2023-12-26 MED ORDER — CEPHALEXIN 500 MG PO CAPS: 500.0000 mg | ORAL_CAPSULE | Freq: Three times a day (TID) | ORAL | 0 refills | Status: AC

## 2023-12-26 MED ORDER — HYDROCODONE-ACETAMINOPHEN 5-325 MG PO TABS: 1.0000 | ORAL_TABLET | Freq: Four times a day (QID) | ORAL | 0 refills | Status: AC | PRN

## 2023-12-26 NOTE — Patient Instructions (Signed)

## 2023-12-26 NOTE — Progress Notes (Signed)
   Assessment: 1. Encounter for vasectomy     Plan: Post vasectomy instructions given Rx sent Post vasectomy semen analysis in 12 weeks  Chief Complaint:  Chief Complaint  Patient presents with   VAS    History of Present Illness:  Luis Thompson is a 37 y.o. male who is seen for vasectomy. He is married with 2 children.  No history of scrotal trauma or infection.  Past Medical History:  Past Medical History:  Diagnosis Date   Gastritis     Past Surgical History:  No past surgical history on file.  Allergies:  No Known Allergies  Family History:  Family History  Problem Relation Age of Onset   Diabetes Mother    Cancer Maternal Grandmother        breast   Arrhythmia Neg Hx    Fainting Neg Hx    Heart attack Neg Hx    Heart disease Neg Hx    Heart failure Neg Hx     Social History:  Social History   Tobacco Use   Smoking status: Former    Current packs/day: 0.50    Types: Cigarettes   Smokeless tobacco: Never  Vaping Use   Vaping status: Never Used  Substance Use Topics   Alcohol use: No   Drug use: No    ROS: Constitutional:  Negative for fever, chills, weight loss CV: Negative for chest pain, previous MI, hypertension Respiratory:  Negative for shortness of breath, wheezing, sleep apnea, frequent cough GI:  Negative for nausea, vomiting, bloody stool, GERD  Physical exam: BP 132/83   Pulse 83   Ht 6' 1 (1.854 m)   Wt 286 lb (129.7 kg)   BMI 37.73 kg/m  GENERAL APPEARANCE:  Well appearing, well developed, well nourished, NAD HEENT:  Atraumatic, normocephalic, oropharynx clear NECK:  Supple without lymphadenopathy or thyromegaly ABDOMEN:  Soft, non-tender, no masses EXTREMITIES:  Moves all extremities well, without clubbing, cyanosis, or edema NEUROLOGIC:  Alert and oriented x 3, normal gait, CN II-XII grossly intact MENTAL STATUS:  appropriate BACK:  Non-tender to palpation, No CVAT SKIN:  Warm, dry, and  intact   Results: None  VASECTOMY PROCEDURE:  Luis Thompson presents for vasectomy following previous vasectomy consultation and permit is signed.  The patient's anterior scrotal wall is shaved and prepped with Betadine in standard sterile fashion.  1% lidocaine is used as local anesthetic in the scrotal and peri vasal tissue.  A standard median raphe punch incision is made and a no scalpel technique vasectomy is performed.  Bilateral vas are isolated from the peri vasal tissue and an approximately 1 cm segment of vas is excised.  Proximal and distal segments are internally cauterized with electric heat cautery. Interposition of perivasal tissue was performed.  Bilateral palpation confirms bilateral vasectomy defect and no significant bleeding or hematoma is identified.  Neosporin gauze dressing and a scrotal support are applied.    Disposition: Patient is discharged home with Rx for pain medication and antibiotics.  Patient is given routine vasectomy instructions.   Most importantly, he is instructed and cautioned again regarding the need for protected intercourse until such time that a single  negative semen analysis has been obtained.  The initial semen analysis will be checked in approximately 12  weeks.  The patient reports a clear understanding.  He will call with any interval questions or concerns.

## 2024-03-26 ENCOUNTER — Other Ambulatory Visit: Payer: BC Managed Care – PPO
